# Patient Record
Sex: Male | Born: 2000 | Race: White | Hispanic: No | Marital: Single | Smoking: Never smoker
Health system: Southern US, Community
[De-identification: ages and names within clinical notes are randomized; demographics above are authoritative.]

## PROBLEM LIST (undated history)

## (undated) DIAGNOSIS — L209 Atopic dermatitis, unspecified: Secondary | ICD-10-CM

## (undated) DIAGNOSIS — J45909 Unspecified asthma, uncomplicated: Secondary | ICD-10-CM

## (undated) HISTORY — DX: Atopic dermatitis, unspecified: L20.9

---
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
## 2023-03-08 IMAGING — CR DG CHEST 2V
1 series · 2 of 2 positions shown · non-contrast
Comparison: None.

CLINICAL DATA: Fever, congestion, cough and rash.

EXAM:
CHEST - 2 VIEW

[Series 1: w chest pa · 0.14mm/px · 2 of 2 slices shown]
[im 1/2]
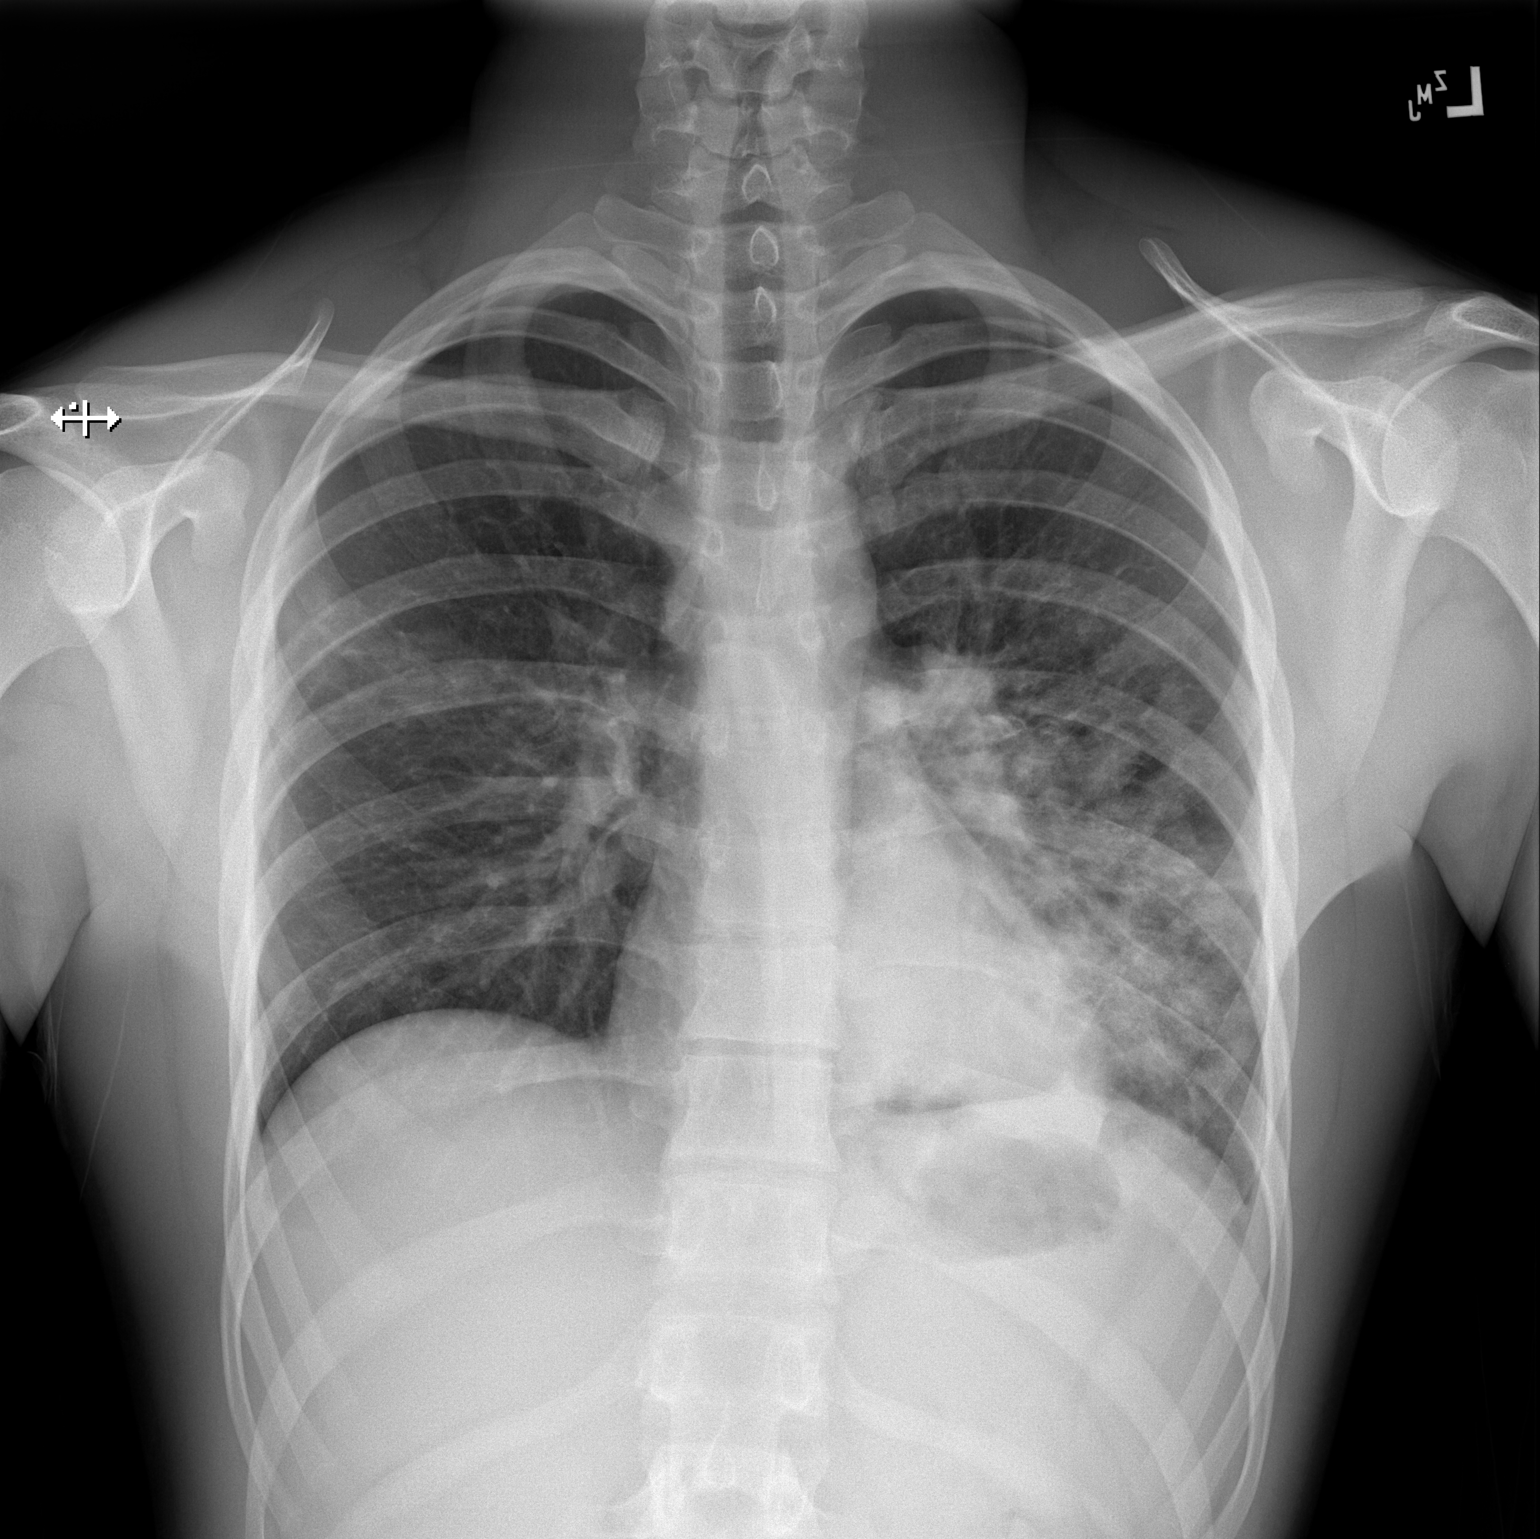
[im 2/2]
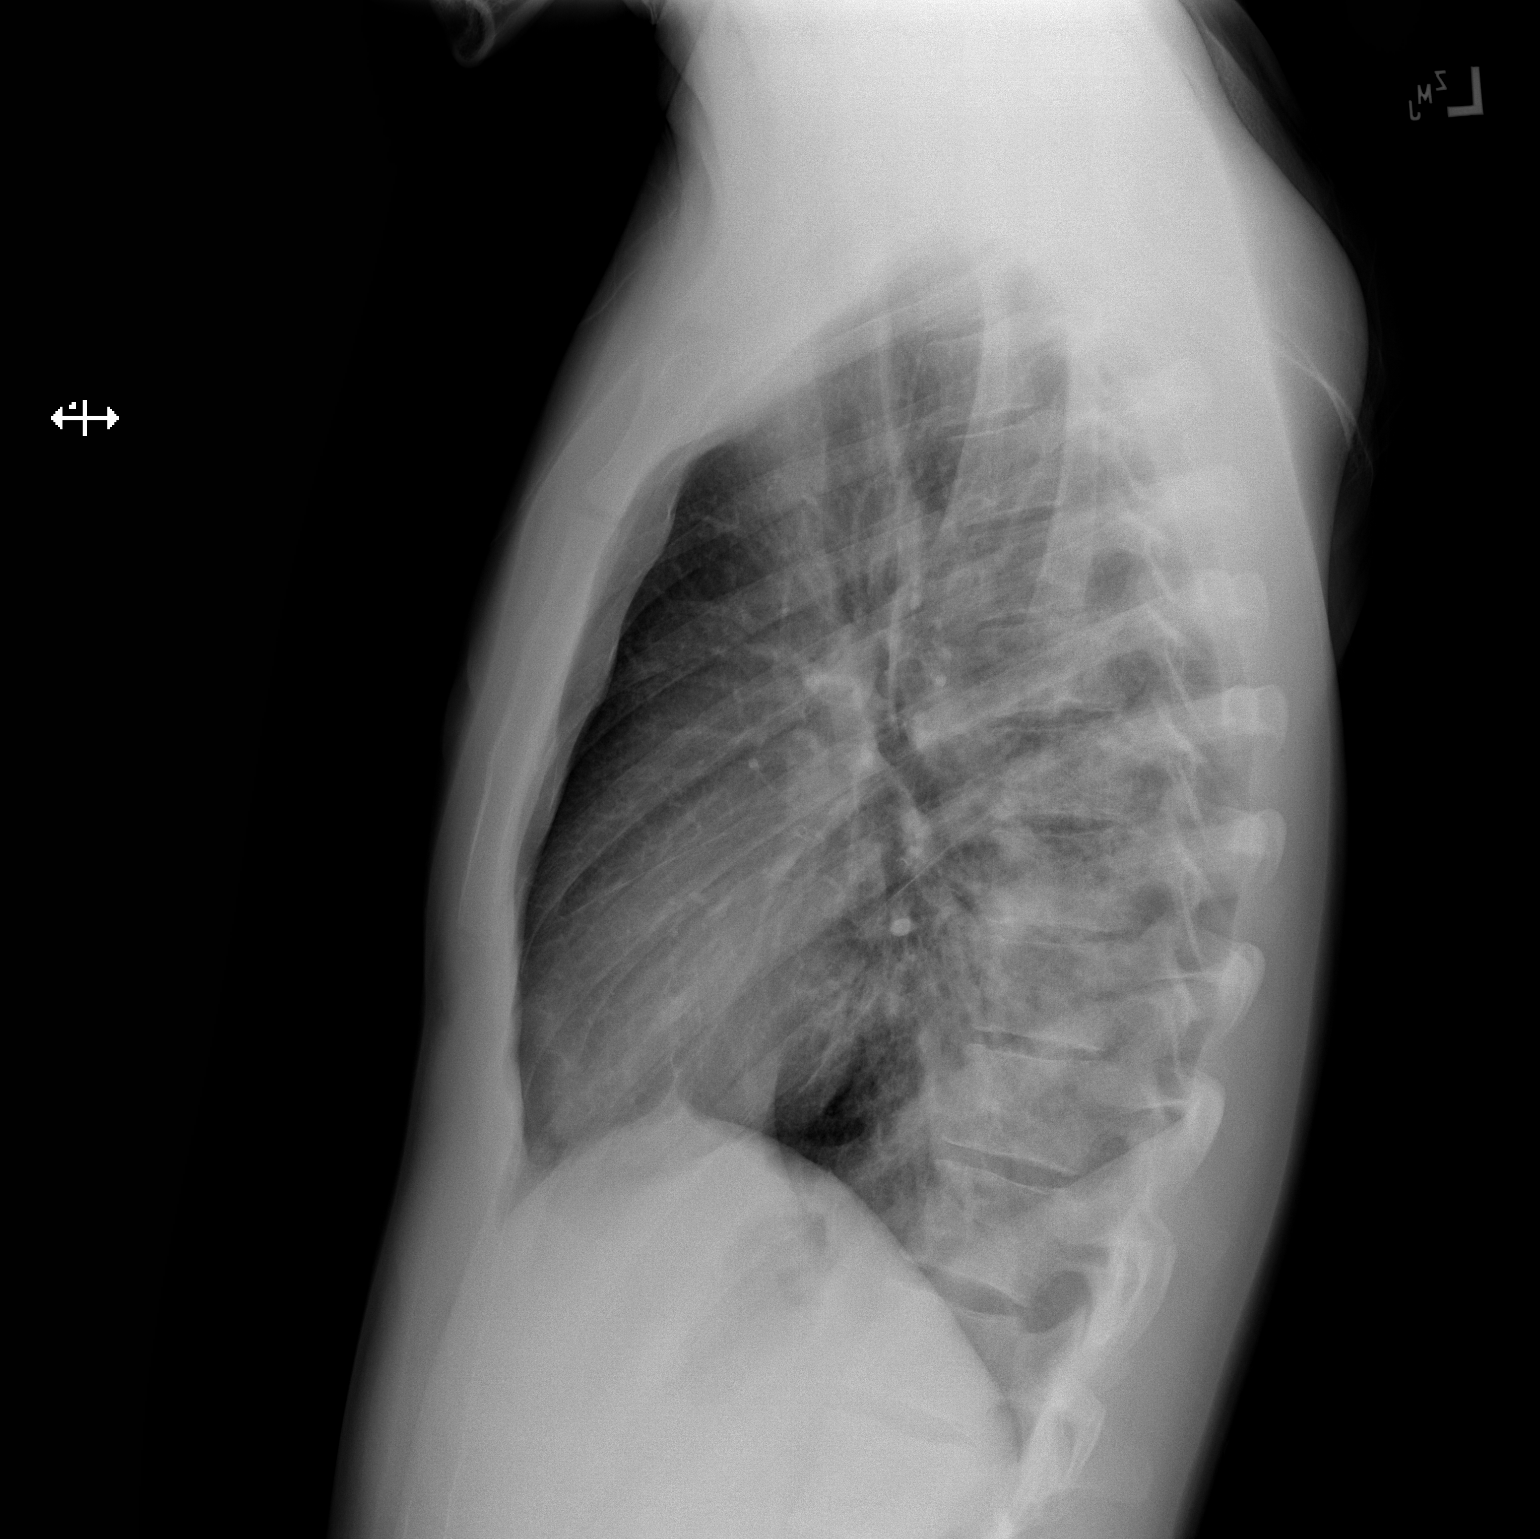

[2 of 2 positions shown; findings below may reference images not displayed]

FINDINGS: Marked severity patchy left lower lobe infiltrate is seen. Very mild
infiltrate is also seen within the periphery of the right upper
lobe. There is no evidence of a pleural effusion or pneumothorax.
The heart size and mediastinal contours are within normal limits.
The visualized skeletal structures are unremarkable.
IMPRESSION: 1. Marked severity left lower lobe and very mild right upper lobe
infiltrates.
